# Patient Record
Sex: Male | Born: 2000 | Race: White | Hispanic: No | Marital: Married | Smoking: Never smoker
Health system: Southern US, Community
[De-identification: ages and names within clinical notes are randomized; demographics above are authoritative.]

## PROBLEM LIST (undated history)

## (undated) DIAGNOSIS — C679 Malignant neoplasm of bladder, unspecified: Secondary | ICD-10-CM

## (undated) HISTORY — PX: BLADDER SURGERY: SHX569

---
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
## 2022-08-07 ENCOUNTER — Emergency Department: Payer: BC Managed Care – PPO

## 2022-08-07 ENCOUNTER — Other Ambulatory Visit: Payer: Self-pay

## 2022-08-07 ENCOUNTER — Emergency Department
Admission: EM | Admit: 2022-08-07 | Discharge: 2022-08-07 | Disposition: A | Discharge status: Home / Self Care | Payer: BC Managed Care – PPO | Attending: Student in an Organized Health Care Education/Training Program | Admitting: Student in an Organized Health Care Education/Training Program

## 2022-08-07 DIAGNOSIS — R053 Chronic cough: Secondary | ICD-10-CM | POA: Diagnosis not present

## 2022-08-07 DIAGNOSIS — R0602 Shortness of breath: Secondary | ICD-10-CM | POA: Diagnosis not present

## 2022-08-07 DIAGNOSIS — Z20822 Contact with and (suspected) exposure to covid-19: Secondary | ICD-10-CM | POA: Diagnosis not present

## 2022-08-07 HISTORY — DX: Malignant neoplasm of bladder, unspecified: C67.9

## 2022-08-07 LAB — CBC WITH DIFFERENTIAL/PLATELET
Abs Immature Granulocytes: 0.03 10*3/uL (ref 0.00–0.07)
Basophils Absolute: 0.1 10*3/uL (ref 0.0–0.1)
Basophils Relative: 1 %
Eosinophils Absolute: 0.1 10*3/uL (ref 0.0–0.5)
Eosinophils Relative: 1 %
HCT: 39.2 % (ref 39.0–52.0)
Hemoglobin: 13.3 g/dL (ref 13.0–17.0)
Immature Granulocytes: 0 %
Lymphocytes Relative: 27 %
Lymphs Abs: 2.7 10*3/uL (ref 0.7–4.0)
MCH: 29 pg (ref 26.0–34.0)
MCHC: 33.9 g/dL (ref 30.0–36.0)
MCV: 85.4 fL (ref 80.0–100.0)
Monocytes Absolute: 0.6 10*3/uL (ref 0.1–1.0)
Monocytes Relative: 6 %
Neutro Abs: 6.4 10*3/uL (ref 1.7–7.7)
Neutrophils Relative %: 65 %
Platelets: 233 10*3/uL (ref 150–400)
RBC: 4.59 MIL/uL (ref 4.22–5.81)
RDW: 12.4 % (ref 11.5–15.5)
WBC: 9.9 10*3/uL (ref 4.0–10.5)
nRBC: 0 % (ref 0.0–0.2)

## 2022-08-07 LAB — BASIC METABOLIC PANEL
Anion gap: 8 (ref 5–15)
BUN: 13 mg/dL (ref 6–20)
CO2: 21 mmol/L — ABNORMAL LOW (ref 22–32)
Calcium: 8.8 mg/dL — ABNORMAL LOW (ref 8.9–10.3)
Chloride: 106 mmol/L (ref 98–111)
Creatinine, Ser: 0.75 mg/dL (ref 0.61–1.24)
GFR, Estimated: >60 mL/min (ref >60)
Glucose, Bld: 95 mg/dL (ref 70–99)
Potassium: 3.8 mmol/L (ref 3.5–5.1)
Sodium: 135 mmol/L (ref 135–145)

## 2022-08-07 LAB — D-DIMER, QUANTITATIVE: D-Dimer, Quant: <0.27 ug/mL-FEU (ref 0.00–0.50)

## 2022-08-07 LAB — RESP PANEL BY RT-PCR (RSV, FLU A&B, COVID)  RVPGX2
Influenza A by PCR: NEGATIVE
Influenza B by PCR: NEGATIVE
Resp Syncytial Virus by PCR: NEGATIVE
SARS Coronavirus 2 by RT PCR: NEGATIVE

## 2022-08-07 MED ORDER — ALBUTEROL SULFATE HFA 108 (90 BASE) MCG/ACT IN AERS: 2.0000 | INHALATION_SPRAY | Freq: Four times a day (QID) | RESPIRATORY_TRACT | 2 refills | Status: AC | PRN

## 2022-08-07 MED ORDER — ALBUTEROL SULFATE (2.5 MG/3ML) 0.083% IN NEBU
2.5000 mg | INHALATION_SOLUTION | Freq: Once | RESPIRATORY_TRACT | Status: AC
  Administered 2022-08-07: 2.5 mg via RESPIRATORY_TRACT
  Filled 2022-08-07: qty 3

## 2022-08-07 NOTE — ED Provider Notes (Signed)
[facility] Provider Note    Event Date/Time   First MD Initiated Contact with Patient 08/07/22 [phone]     (approximate)   History   Shortness of Breath   HPI  Frank Townsend is a 22 y.o. who presents to the ER for evaluation of episodes of shortness of breath.  No history of bronchitis or asthma.  Does endorse significant stressors at work.  Has had some intermittent cough and states that some of the symptoms have been going on nearly a year.       Physical Exam   Triage Vital Signs: ED Triage Vitals  Enc Vitals Group     BP 08/07/22 0016 (!) 137/91     Pulse Rate 08/07/22 0016 79     Resp 08/07/22 0011 19     Temp 08/07/22 0016 98 F (36.7 C)     Temp Source 08/07/22 0016 Oral     SpO2 08/07/22 0016 100 %     Weight 08/07/22 0012 179 lb 10.8 oz (81.5 kg)     Height 08/07/22 0012 '5\' 5"'$  (1.651 m)     Head Circumference --      Peak Flow --      Pain Score 08/07/22 0011 3     Pain Loc --      Pain Edu? --      Excl. in [place]? --     Most recent vital signs: Vitals:   08/07/22 0016 08/07/22 0017  BP: (!) 137/91   Pulse: 79   Resp:    Temp: 98 F (36.7 C) 98.1 F (36.7 C)  SpO2: 100%      Constitutional: Alert  Eyes: Conjunctivae are normal.  Head: Atraumatic. Nose: No congestion/rhinnorhea. Mouth/Throat: Mucous membranes are moist.   Neck: Painless ROM.  Cardiovascular:   Good peripheral circulation. Respiratory: Normal respiratory effort.  No retractions.  Gastrointestinal: Soft and nontender.  Musculoskeletal:  no deformity Neurologic:  MAE spontaneously. No gross focal neurologic deficits are appreciated.  Skin:  Skin is warm, dry and intact. No rash noted. Psychiatric: Mood and affect are normal. Speech and behavior are normal.    ED Results / Procedures / Treatments   Labs (all labs ordered are listed, but only abnormal results are displayed) Labs Reviewed  BASIC METABOLIC PANEL - Abnormal; Notable for the following  components:      Result Value   CO2 21 (*)    Calcium 8.8 (*)    All other components within normal limits  RESP PANEL BY RT-PCR (RSV, FLU A&B, COVID)  [id number]  CBC WITH DIFFERENTIAL/PLATELET  D-DIMER, QUANTITATIVE     EKG  ED ECG REPORT I, Merlyn Lot, the attending physician, personally viewed and interpreted this ECG.   Date: 08/07/2022  EKG Time: 0:12  Rate: 80  Rhythm: sinus  Axis: normal  Intervals: normal  ST&T Change: no stemi, no depressions    RADIOLOGY Please see ED Course for my review and interpretation.  I personally reviewed all radiographic images ordered to evaluate for the above acute complaints and reviewed radiology reports and findings.  These findings were personally discussed with the patient.  Please see medical record for radiology report.    PROCEDURES:  Critical Care performed:   Procedures   MEDICATIONS ORDERED IN ED: Medications  albuterol (PROVENTIL) (2.5 MG/3ML) 0.083% nebulizer solution 2.5 mg (2.5 mg Nebulization Given 08/07/22 0113)     IMPRESSION / MDM / ASSESSMENT AND PLAN / ED COURSE  I  reviewed the triage vital signs and the nursing notes.                              Differential diagnosis includes, but is not limited to, asthma, bronchitis, pneumonia, PE, anxiety, stress, anemia  Patient presenting to the ER for evaluation of symptoms as described above.  Based on symptoms, risk factors and considered above differential, this presenting complaint could reflect a potentially life-threatening illness therefore the patient will be placed on continuous pulse oximetry and telemetry for monitoring.  Laboratory evaluation will be sent to evaluate for the above complaints.      Clinical Course as of 08/07/22 0145  Nancy Fetter Aug 07, 2022  0128 Chest x-ray my review and interpretation is without evidence pneumothorax or consolidation. [PR]  0140 Workup is reassuring leukocytosis no anemia D-dimer negative not consistent PE.   Patient does endorse significant stressors at home.  Discussed conservative management and signs and symptoms which she should return to the ER.  Patient agreed with plan. [PR]    Clinical Course User Index [PR] Merlyn Lot, MD   FINAL CLINICAL IMPRESSION(S) / ED DIAGNOSES   Final diagnoses:  Chronic cough  Shortness of breath     Rx / DC Orders   ED Discharge Orders          Ordered    albuterol (VENTOLIN HFA) 108 (90 Base) MCG/ACT inhaler  Every 6 hours PRN        08/07/22 0145             Note:  This document was prepared using Dragon voice recognition software and may include unintentional dictation errors.    Merlyn Lot, MD 08/07/22 [phone]

## 2022-08-07 NOTE — ED Triage Notes (Signed)
Pt arrives via POV with CC of intermittent SOB since 1100 this am - associated dizziness, lightheadedness, and cough. Denies nausea/vomiting. Pt appears to be in NAD at this time.

## 2022-09-19 ENCOUNTER — Ambulatory Visit: Payer: Self-pay | Admitting: Nurse Practitioner
